# Patient Record
Sex: Male | Born: 1990 | Race: White | Hispanic: No | Marital: Single | Smoking: Never smoker
Health system: Southern US, Community
[De-identification: ages and names within clinical notes are randomized; demographics above are authoritative.]

---

## 2019-04-08 ENCOUNTER — Emergency Department (HOSPITAL_COMMUNITY): Payer: BC Managed Care – PPO

## 2019-04-08 ENCOUNTER — Other Ambulatory Visit: Payer: Self-pay

## 2019-04-08 ENCOUNTER — Emergency Department (HOSPITAL_COMMUNITY)
Admission: EM | Admit: 2019-04-08 | Discharge: 2019-04-08 | Disposition: A | Discharge status: Home / Self Care | Payer: BC Managed Care – PPO | Attending: Emergency Medicine | Admitting: Emergency Medicine

## 2019-04-08 ENCOUNTER — Encounter (HOSPITAL_COMMUNITY): Payer: Self-pay

## 2019-04-08 DIAGNOSIS — Y9375 Activity, martial arts: Secondary | ICD-10-CM | POA: Diagnosis not present

## 2019-04-08 DIAGNOSIS — Y999 Unspecified external cause status: Secondary | ICD-10-CM | POA: Insufficient documentation

## 2019-04-08 DIAGNOSIS — Y9239 Other specified sports and athletic area as the place of occurrence of the external cause: Secondary | ICD-10-CM | POA: Diagnosis not present

## 2019-04-08 DIAGNOSIS — W500XXA Accidental hit or strike by another person, initial encounter: Secondary | ICD-10-CM | POA: Insufficient documentation

## 2019-04-08 DIAGNOSIS — S83004A Unspecified dislocation of right patella, initial encounter: Secondary | ICD-10-CM | POA: Insufficient documentation

## 2019-04-08 DIAGNOSIS — R52 Pain, unspecified: Secondary | ICD-10-CM

## 2019-04-08 DIAGNOSIS — S8991XA Unspecified injury of right lower leg, initial encounter: Secondary | ICD-10-CM | POA: Diagnosis present

## 2019-04-08 MED ORDER — HYDROMORPHONE HCL 1 MG/ML IJ SOLN
1.0000 mg | Freq: Once | INTRAMUSCULAR | Status: AC
  Administered 2019-04-08: 1 mg via INTRAVENOUS
  Filled 2019-04-08: qty 1

## 2019-04-08 MED ORDER — HYDROCODONE-ACETAMINOPHEN 5-325 MG PO TABS: 1.0000 | ORAL_TABLET | Freq: Four times a day (QID) | ORAL | 0 refills | Status: AC | PRN

## 2019-04-08 MED ORDER — HYDROCODONE-ACETAMINOPHEN 5-325 MG PO TABS: 1.0000 | ORAL_TABLET | Freq: Four times a day (QID) | ORAL | 0 refills | Status: DC | PRN

## 2019-04-08 MED ORDER — ONDANSETRON HCL 4 MG/2ML IJ SOLN
4.0000 mg | Freq: Once | INTRAMUSCULAR | Status: AC
  Administered 2019-04-08: 4 mg via INTRAVENOUS
  Filled 2019-04-08: qty 2

## 2019-04-08 NOTE — Discharge Instructions (Signed)
You can take Tylenol or Ibuprofen as directed for pain. You can alternate Tylenol and Ibuprofen every 4 hours. If you take Tylenol at 1pm, then you can take Ibuprofen at 5pm. Then you can take Tylenol again at 9pm.   Take pain medications as directed for break through pain. Do not drive or operate machinery while taking this medication.   Follow-up with the referred orthopedic doctor. Call their office and arrange an appointment.   Return the emergency department for any worsening pain, redness or swelling of the knee, numbness/weakness or any other worsening or concerning symptoms.

## 2019-04-08 NOTE — ED Provider Notes (Signed)
[facility]PT Provider Note   CSN: [id number] Arrival date & time: 04/08/19  2012    History   Chief Complaint Chief Complaint  Patient presents with   Knee Pain   Leg Injury    HPI Lucas Cole is a 28 y.o. male who presents for evaluation of right knee pain after an injury in jiu-jitzu.  Patient reports that he was on the ground when another person put pressure on his knee and he felt a pop with immediate pain.  He reports that since then, he has not been able to ambulate or bear weight on right lower extremity.  He states that he has a little bit of tingling sensation to the toes but is otherwise able to feel.  He states he has not been able to move the knee much secondary to pain.  He was given 100 mcg of fentanyl by EMS.     The history is provided by the patient.    History reviewed. No pertinent past medical history.  There are no active problems to display for this patient.   History reviewed. No pertinent surgical history.      Home Medications    Prior to Admission medications   Medication Sig Start Date End Date Taking? Authorizing Provider  HYDROcodone-acetaminophen (NORCO/VICODIN) 5-325 MG tablet Take 1-2 tablets by mouth every 6 (six) hours as needed. 04/08/19   Maxwell CaulLayden, Adarrius Graeff A, PA-C    Family History History reviewed. No pertinent family history.  Social History Social History   Tobacco Use   Smoking status: Never Smoker   Smokeless tobacco: Never Used  Substance Use Topics   Alcohol use: Not on file   Drug use: Never     Allergies   Patient has no known allergies.   Review of Systems Review of Systems  Musculoskeletal:       Right knee pain  Neurological: Positive for numbness. Negative for weakness.  All other systems reviewed and are negative.    Physical Exam Updated Vital Signs BP 138/68 (BP Location: Left Arm)    Pulse 78    Temp 98.7 F (37.1 C) (Oral)    Resp 20    SpO2 97%    Physical Exam Vitals signs and nursing note reviewed.  Constitutional:      Appearance: He is well-developed.  HENT:     Head: Normocephalic and atraumatic.  Eyes:     General: No scleral icterus.       Right eye: No discharge.        Left eye: No discharge.     Conjunctiva/sclera: Conjunctivae normal.  Cardiovascular:     Pulses:          Dorsalis pedis pulses are 2+ on the right side and 2+ on the left side.  Pulmonary:     Effort: Pulmonary effort is normal.  Musculoskeletal:     Comments: Tenderness palpation noted to right knee with obvious deformity noted.  Limited flexion/tension secondary to pain.  No bony tenderness noted to right hip, right tib-fib, right ankle.  No tenderness palpation of left lower extremity.  Full range of motion of left lower extremity intact with any difficulty.  Skin:    General: Skin is warm and dry.     Comments: Good distal cap refill. RLE is not dusky in appearance or cool to touch.  Neurological:     Mental Status: He is alert.     Comments: Slight tingling sensation of distal aspect of the  toes but otherwise full sensation all dermatomes.  Flexion/plantarflexion of ankle intact any difficulty.  Psychiatric:        Speech: Speech normal.        Behavior: Behavior normal.      ED Treatments / Results  Labs (all labs ordered are listed, but only abnormal results are displayed) Labs Reviewed - No data to display  EKG None  Radiology Dg Knee 1-2 Views Right  Result Date: 04/08/2019 CLINICAL DATA:  Post reduction EXAM: RIGHT KNEE - 1-2 VIEW COMPARISON:  04/08/2019 FINDINGS: Interval reduction of previously noted lateral patellar dislocation. No acute fracture. Trace knee effusion IMPRESSION: Reduction of lateral patellar dislocation.  Trace knee effusion. Electronically Signed   By: Jasmine PangKim  Fujinaga M.D.   On: 04/08/2019 22:13   Dg Knee Complete 4 Views Right  Result Date: 04/08/2019 CLINICAL DATA:  Pain, swelling, injury during jujitsu  EXAM: RIGHT KNEE - COMPLETE 4+ VIEW COMPARISON:  None. FINDINGS: Lateral dislocation of the patella with extensive soft tissue swelling about the knee and a trace left knee joint effusion. No other acute fracture or traumatic malalignment. IMPRESSION: Lateral dislocation of the patella. Electronically Signed   By: Kreg ShropshirePrice  DeHay M.D.   On: 04/08/2019 21:00    Procedures Reduction of dislocation  Date/Time: 04/08/2019 10:28 PM Performed by: Maxwell CaulLayden, Alexsys Eskin A, PA-C Authorized by: Maxwell CaulLayden, Salih Williamson A, PA-C  Consent: Verbal consent obtained. Consent given by: patient Patient understanding: patient states understanding of the procedure being performed Patient consent: the patient's understanding of the procedure matches consent given Procedure consent: procedure consent matches procedure scheduled Relevant documents: relevant documents present and verified Test results: test results available and properly labeled Site marked: the operative site was marked Imaging studies: imaging studies available Patient identity confirmed: verbally with patient Time out: Immediately prior to procedure a "time out" was called to verify the correct patient, procedure, equipment, support staff and site/side marked as required. Local anesthesia used: no  Anesthesia: Local anesthesia used: no  Sedation: Patient sedated: no  Patient tolerance: patient tolerated the procedure well with no immediate complications Comments: Prior to reduction, patient with good distal pulses, cap refill.  Reduction as documented above.  Post reduction showed good DP pulses, good cap refill.    (including critical care time)  Medications Ordered in ED Medications  HYDROmorphone (DILAUDID) injection 1 mg (1 mg Intravenous Given 04/08/19 2142)  ondansetron (ZOFRAN) injection 4 mg (4 mg Intravenous Given 04/08/19 2141)     Initial Impression / Assessment and Plan / ED Course  I have reviewed the triage vital signs and the nursing  notes.  Pertinent labs & imaging results that were available during my care of the patient were reviewed by me and considered in my medical decision making (see chart for details).        28 year old male who presents for evaluation of right knee pain.  He he was in jujitsu class when somebody put pressure on his knee and caused deformity.  He is not been able to ambulate or bear weight.  He does report some slight tingling sensation toes but otherwise no numbness. Patient is afebrile, non-toxic appearing, sitting comfortably on examination table. Vital signs reviewed and stable.  On exam, he has obvious deformity noted to right knee.  Concern for patellar dislocation versus fracture.  Imaging ordered in triage.  X-ray reviewed.  There is evidence of knee dislocation.  Post reduction x-ray shows resolution and reduction.  Patient placed in a knee immobilizer.  Encourage follow-up  with orthopedics as directed. At this time, patient exhibits no emergent life-threatening condition that require further evaluation in ED or admission. Patient had ample opportunity for questions and discussion. All patient's questions were answered with full understanding. Strict return precautions discussed. Patient expresses understanding and agreement to plan.   Portions of this note were generated with [employer]. Dictation errors may occur despite best attempts at proofreading.   Final Clinical Impressions(s) / ED Diagnoses   Final diagnoses:  Dislocation of right patella, initial encounter    ED Discharge Orders         Ordered    HYDROcodone-acetaminophen (NORCO/VICODIN) 5-325 MG tablet  Every 6 hours PRN,   Status:  Discontinued     04/08/19 2229    HYDROcodone-acetaminophen (NORCO/VICODIN) 5-325 MG tablet  Every 6 hours PRN     04/08/19 2230           Desma Mcgregor 04/09/19 0115    Lacretia Leigh, MD 04/11/19 [phone]

## 2019-04-08 NOTE — ED Triage Notes (Signed)
Pt BIB EMS from jiu-jjizu Class second day.  Pt c/o pain to rt knee, with apparent swelling, and deformities are noted.    20 rt hand  10 37mcg of Fentanyl.  EMS 116/62, HR 72, RR 16, 97%  RA

## 2020-01-11 IMAGING — CR RIGHT KNEE - COMPLETE 4+ VIEW
5 series · 5 of 5 positions shown · non-contrast
Comparison: None.

CLINICAL DATA: Pain, swelling, injury during jujitsu

EXAM:
RIGHT KNEE - COMPLETE 4+ VIEW

[x knee ap right]
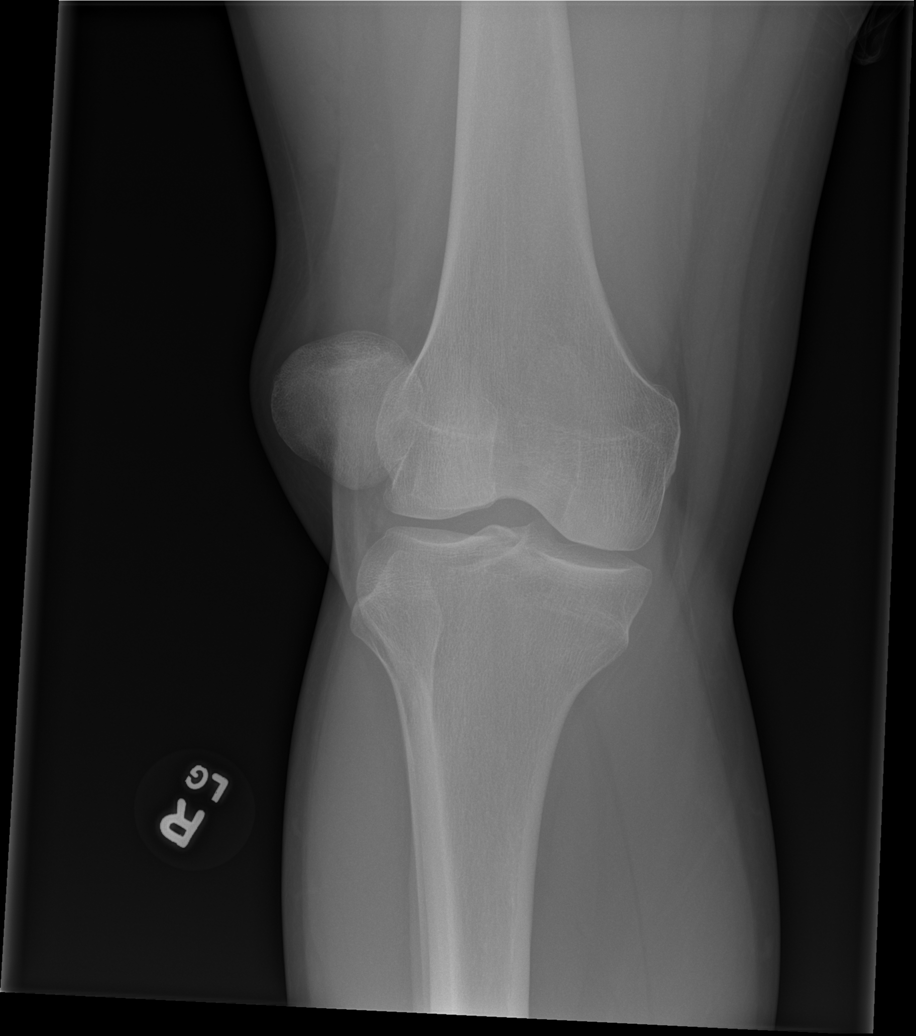

[x knee obl right (1 of 2)]
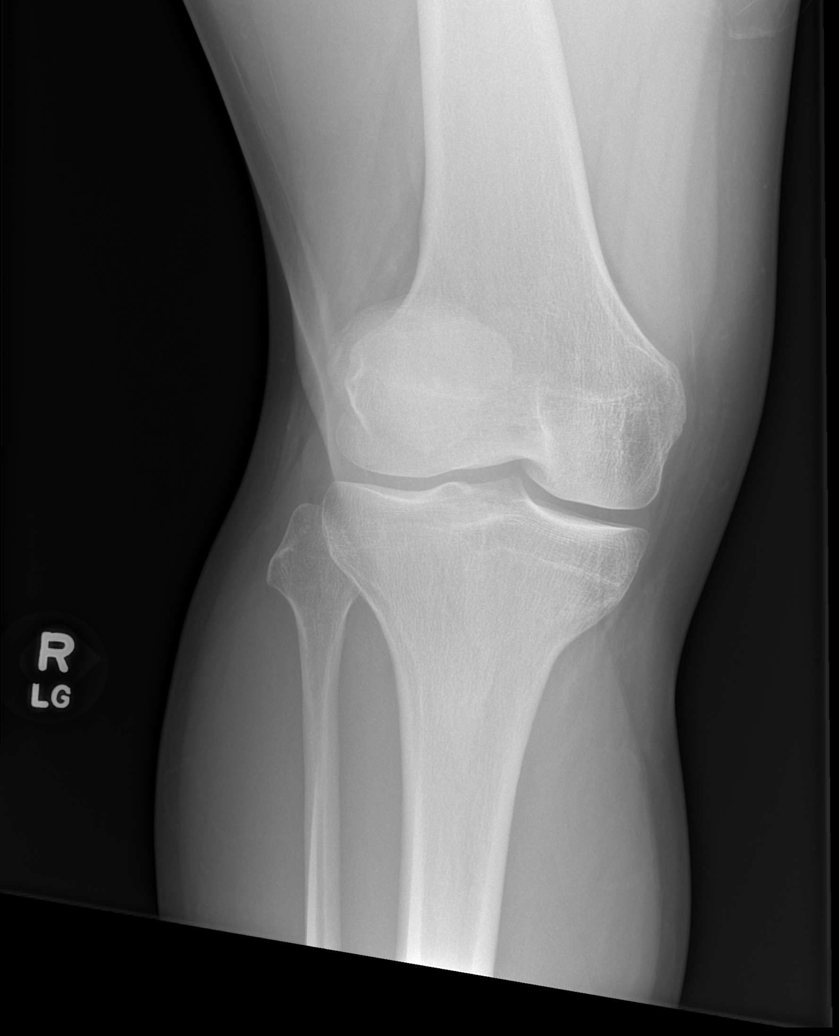

[x knee obl right (2 of 2)]
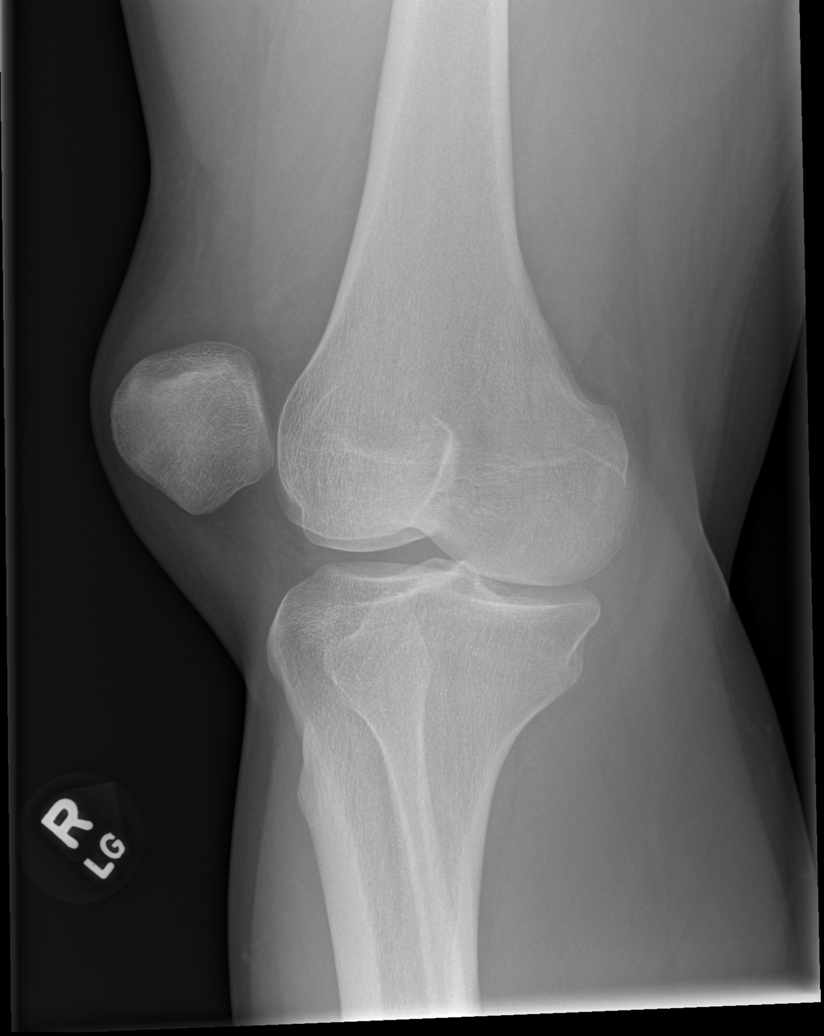

[x knee lat right (1 of 2)]
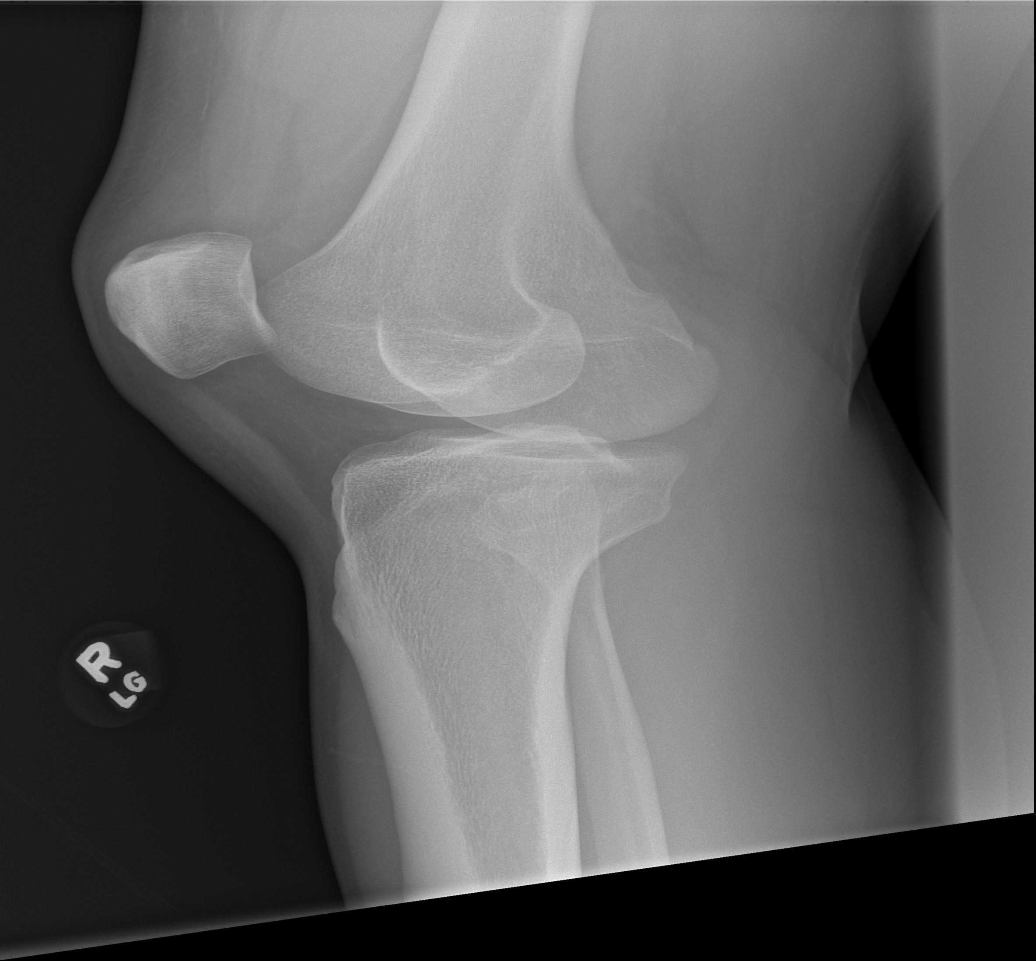

[x knee lat right (2 of 2)]
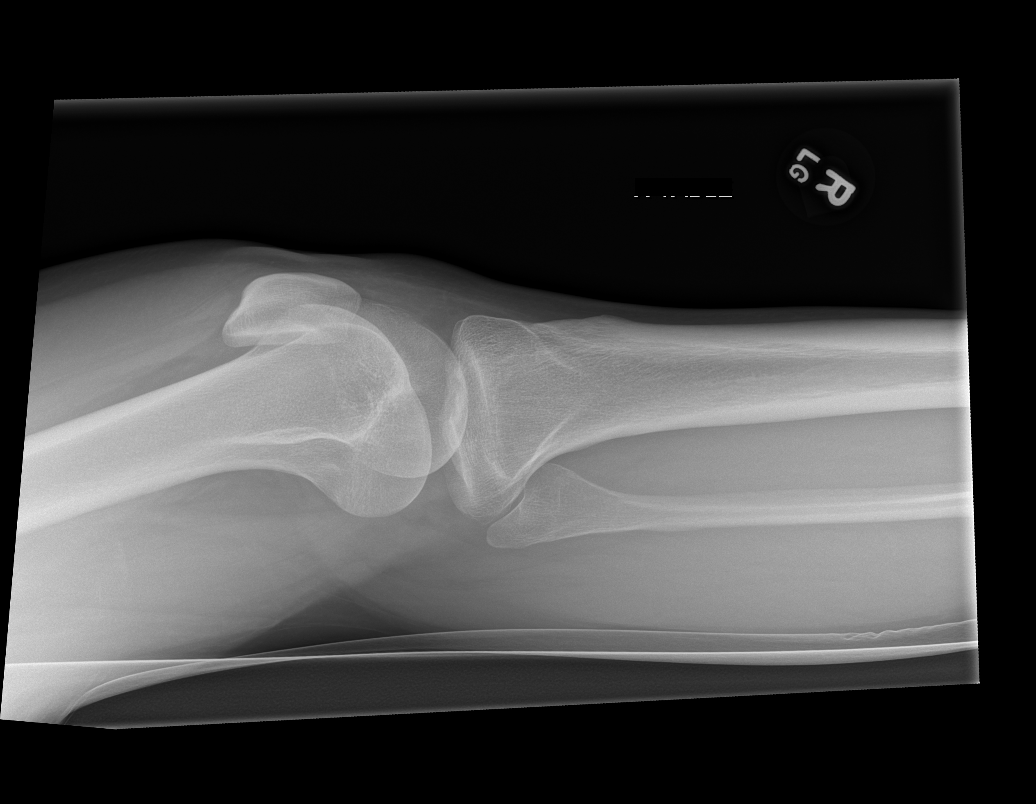

[5 of 5 positions shown; findings below may reference images not displayed]

FINDINGS: Lateral dislocation of the patella with extensive soft tissue
swelling about the knee and a trace left knee joint effusion. No
other acute fracture or traumatic malalignment.
IMPRESSION: Lateral dislocation of the patella.
